# Patient Record
Sex: Female | Born: 1960 | Race: White | Hispanic: No | Smoking: Former smoker
Health system: Southern US, Community
[De-identification: ages and names within clinical notes are randomized; demographics above are authoritative.]

## PROBLEM LIST (undated history)

## (undated) DIAGNOSIS — M8580 Other specified disorders of bone density and structure, unspecified site: Secondary | ICD-10-CM

## (undated) DIAGNOSIS — G032 Benign recurrent meningitis [Mollaret]: Secondary | ICD-10-CM

## (undated) HISTORY — DX: Benign recurrent meningitis (mollaret): G03.2

## (undated) HISTORY — DX: Other specified disorders of bone density and structure, unspecified site: M85.80

## (undated) HISTORY — PX: CHOLECYSTECTOMY: SHX55

## (undated) HISTORY — PX: ABDOMINAL HYSTERECTOMY: SHX81

## (undated) HISTORY — PX: BREAST EXCISIONAL BIOPSY: SUR124

---

## 2008-04-02 ENCOUNTER — Other Ambulatory Visit: Admission: RE | Admit: 2008-04-02 | Discharge: 2008-04-02 | Payer: Self-pay | Admitting: Internal Medicine

## 2008-08-20 ENCOUNTER — Ambulatory Visit: Payer: Self-pay | Admitting: Internal Medicine

## 2009-07-29 ENCOUNTER — Encounter: Admission: RE | Admit: 2009-07-29 | Discharge: 2009-07-29 | Payer: Self-pay | Admitting: Internal Medicine

## 2009-08-22 ENCOUNTER — Ambulatory Visit: Payer: Self-pay | Admitting: Internal Medicine

## 2010-01-02 ENCOUNTER — Ambulatory Visit: Payer: Self-pay | Admitting: Internal Medicine

## 2010-02-23 ENCOUNTER — Ambulatory Visit: Payer: Self-pay | Admitting: Internal Medicine

## 2010-07-31 ENCOUNTER — Encounter: Admission: RE | Admit: 2010-07-31 | Discharge: 2010-07-31 | Payer: Self-pay | Admitting: Internal Medicine

## 2010-09-18 ENCOUNTER — Ambulatory Visit: Payer: Self-pay | Admitting: Internal Medicine

## 2010-10-30 ENCOUNTER — Ambulatory Visit: Payer: Self-pay | Admitting: Internal Medicine

## 2010-10-31 ENCOUNTER — Encounter
Admission: RE | Admit: 2010-10-31 | Discharge: 2010-10-31 | Payer: Self-pay | Source: Home / Self Care | Attending: Internal Medicine | Admitting: Internal Medicine

## 2010-12-15 ENCOUNTER — Other Ambulatory Visit: Payer: Self-pay | Admitting: Internal Medicine

## 2010-12-15 DIAGNOSIS — N63 Unspecified lump in unspecified breast: Secondary | ICD-10-CM

## 2010-12-20 ENCOUNTER — Ambulatory Visit
Admission: RE | Admit: 2010-12-20 | Discharge: 2010-12-20 | Disposition: A | Discharge status: Home / Self Care | Payer: 59 | Source: Ambulatory Visit | Attending: Internal Medicine | Admitting: Internal Medicine

## 2010-12-20 ENCOUNTER — Other Ambulatory Visit: Payer: Self-pay | Admitting: Internal Medicine

## 2010-12-20 DIAGNOSIS — N6310 Unspecified lump in the right breast, unspecified quadrant: Secondary | ICD-10-CM

## 2010-12-20 DIAGNOSIS — N63 Unspecified lump in unspecified breast: Secondary | ICD-10-CM

## 2010-12-21 ENCOUNTER — Other Ambulatory Visit: Payer: Self-pay | Admitting: Diagnostic Radiology

## 2010-12-21 ENCOUNTER — Other Ambulatory Visit: Payer: Self-pay | Admitting: Internal Medicine

## 2010-12-21 ENCOUNTER — Ambulatory Visit
Admission: RE | Admit: 2010-12-21 | Discharge: 2010-12-21 | Disposition: A | Discharge status: Home / Self Care | Payer: 59 | Source: Ambulatory Visit | Attending: Internal Medicine | Admitting: Internal Medicine

## 2010-12-21 DIAGNOSIS — N6320 Unspecified lump in the left breast, unspecified quadrant: Secondary | ICD-10-CM

## 2010-12-21 DIAGNOSIS — N6310 Unspecified lump in the right breast, unspecified quadrant: Secondary | ICD-10-CM

## 2010-12-21 LAB — HM MAMMOGRAPHY

## 2011-05-13 ENCOUNTER — Emergency Department (HOSPITAL_BASED_OUTPATIENT_CLINIC_OR_DEPARTMENT_OTHER)
Admission: EM | Admit: 2011-05-13 | Discharge: 2011-05-13 | Disposition: A | Discharge status: Home / Self Care | Payer: 59 | Attending: Emergency Medicine | Admitting: Emergency Medicine

## 2011-05-13 ENCOUNTER — Emergency Department (INDEPENDENT_AMBULATORY_CARE_PROVIDER_SITE_OTHER): Payer: 59

## 2011-05-13 DIAGNOSIS — M79609 Pain in unspecified limb: Secondary | ICD-10-CM

## 2011-05-13 DIAGNOSIS — X58XXXA Exposure to other specified factors, initial encounter: Secondary | ICD-10-CM | POA: Insufficient documentation

## 2011-05-13 DIAGNOSIS — M7730 Calcaneal spur, unspecified foot: Secondary | ICD-10-CM

## 2011-05-13 DIAGNOSIS — Y9316 Activity, rowing, canoeing, kayaking, rafting and tubing: Secondary | ICD-10-CM | POA: Insufficient documentation

## 2011-05-13 DIAGNOSIS — M25579 Pain in unspecified ankle and joints of unspecified foot: Secondary | ICD-10-CM | POA: Insufficient documentation

## 2011-07-23 ENCOUNTER — Encounter: Payer: Self-pay | Admitting: Internal Medicine

## 2011-07-23 ENCOUNTER — Ambulatory Visit (INDEPENDENT_AMBULATORY_CARE_PROVIDER_SITE_OTHER): Payer: 59 | Admitting: Internal Medicine

## 2011-07-23 VITALS — BP 102/64 | HR 60 | Temp 97.8°F | Ht 63.5 in | Wt 146.0 lb

## 2011-07-23 DIAGNOSIS — J069 Acute upper respiratory infection, unspecified: Secondary | ICD-10-CM

## 2011-07-23 DIAGNOSIS — M8580 Other specified disorders of bone density and structure, unspecified site: Secondary | ICD-10-CM

## 2011-07-23 DIAGNOSIS — M949 Disorder of cartilage, unspecified: Secondary | ICD-10-CM

## 2011-07-23 DIAGNOSIS — E559 Vitamin D deficiency, unspecified: Secondary | ICD-10-CM

## 2011-07-23 DIAGNOSIS — H6500 Acute serous otitis media, unspecified ear: Secondary | ICD-10-CM

## 2011-07-23 DIAGNOSIS — Z8661 Personal history of infections of the central nervous system: Secondary | ICD-10-CM

## 2011-07-31 ENCOUNTER — Telehealth: Payer: Self-pay | Admitting: Internal Medicine

## 2011-07-31 MED ORDER — AZITHROMYCIN 250 MG PO TABS: ORAL_TABLET | ORAL | Status: AC

## 2011-07-31 NOTE — Telephone Encounter (Signed)
Please refill z-pak.

## 2011-08-05 DIAGNOSIS — M8580 Other specified disorders of bone density and structure, unspecified site: Secondary | ICD-10-CM | POA: Insufficient documentation

## 2011-08-05 DIAGNOSIS — E559 Vitamin D deficiency, unspecified: Secondary | ICD-10-CM | POA: Insufficient documentation

## 2011-08-05 DIAGNOSIS — Z8661 Personal history of infections of the central nervous system: Secondary | ICD-10-CM | POA: Insufficient documentation

## 2011-08-05 NOTE — Progress Notes (Signed)
  Subjective:    Patient ID: Rebecca Dunlap, female    DOB: 01-Aug-1961, 50 y.o.   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 Has come down with URI symptoms, malaise and fatigue. Some ear discomfort. She has a history of osteopenia and vitamin D deficiency. Also has a history of recurrent aseptic meningitis treated with Valtrex 1000 mg daily. Has seen Dr. Kerby Nora at [facility] who has suggested this treatment. Recognizing that it's unusual for aseptic meningitis to recur, nonetheless this treatment seems to work well for her with infrequent exacerbations. Her initial exacerbation was in 2005. 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 In June 2008 she was seen at [facility] and subsequently referred to [facility] and received a 3 week course of high dose Valtrex for recurrent meningitis. She had recurrent headaches and was told to increase Valtrex from 500 mg daily to twice daily for one month. History of positive HSV type II antibiotic, negative HIV antibody. 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

## 2011-08-29 ENCOUNTER — Other Ambulatory Visit: Payer: Self-pay | Admitting: Internal Medicine

## 2011-08-29 DIAGNOSIS — Z1231 Encounter for screening mammogram for malignant neoplasm of breast: Secondary | ICD-10-CM

## 2011-09-14 ENCOUNTER — Ambulatory Visit
Admission: RE | Admit: 2011-09-14 | Discharge: 2011-09-14 | Disposition: A | Discharge status: Home / Self Care | Payer: 59 | Source: Ambulatory Visit | Attending: Internal Medicine | Admitting: Internal Medicine

## 2011-09-14 DIAGNOSIS — Z1231 Encounter for screening mammogram for malignant neoplasm of breast: Secondary | ICD-10-CM

## 2011-10-31 ENCOUNTER — Telehealth: Payer: Self-pay | Admitting: Internal Medicine

## 2011-10-31 NOTE — Telephone Encounter (Signed)
Spoke with Dr. Lenord Fellers and she prefers to see patient.  Advised patient we have scheduled her an appt for Thursday, 12/20 at 10:00 a.m.

## 2011-11-01 ENCOUNTER — Ambulatory Visit: Payer: 59 | Admitting: Internal Medicine

## 2011-11-05 ENCOUNTER — Encounter: Payer: Self-pay | Admitting: Internal Medicine

## 2011-11-05 ENCOUNTER — Ambulatory Visit (INDEPENDENT_AMBULATORY_CARE_PROVIDER_SITE_OTHER): Payer: 59 | Admitting: Internal Medicine

## 2011-11-05 DIAGNOSIS — J069 Acute upper respiratory infection, unspecified: Secondary | ICD-10-CM

## 2011-11-05 DIAGNOSIS — R300 Dysuria: Secondary | ICD-10-CM

## 2011-11-05 DIAGNOSIS — R319 Hematuria, unspecified: Secondary | ICD-10-CM

## 2011-11-05 LAB — POCT URINALYSIS DIPSTICK
Bilirubin, UA: NEGATIVE
Blood, UA: NEGATIVE
Glucose, UA: NEGATIVE
Ketones, UA: NEGATIVE
Spec Grav, UA: 1.01
Urobilinogen, UA: NEGATIVE

## 2011-11-05 NOTE — Patient Instructions (Signed)
Take Levaquin 500 mg daily for 10 days with a meal. Take Hycodan 1 teaspoon every 6 hours as needed for cough. Call if not better in 10 days.

## 2011-11-05 NOTE — Progress Notes (Signed)
  Subjective:    Patient ID: Rebecca Dunlap, female    DOB: 05/11/1961, 50 y.o.   MRN: [id number]  HPI patient recently came down with an upper respiratory infection with discolored sputum production and cough. No fever or shaking chills. Just recently a couple of days ago saw some blood when she wiped after urination. Had some dysuria. Has had a little bit of left paralumbar back pain.    Review of Systems     Objective:   Physical Exam urinalysis today is within normal limits. Inspection of the genital area shows no evidence of bleeding. No rectal bleeding either.  HEENT exam: TMs and pharynx are clear; she sounds nasally congested; neck is supple; chest is clear        Assessment & Plan:  URI  ? Genital excoriation versus early urinary tract infection-urinalysis today is within normal limits. Attended favor of some type of genital excoriation.  Plan: Levaquin 500 milligrams daily for 10 days. Hycodan 8 ounces 1 teaspoon by mouth every 6 hours when necessary cough.

## 2011-11-09 ENCOUNTER — Other Ambulatory Visit: Payer: Self-pay | Admitting: Internal Medicine

## 2011-11-09 NOTE — Telephone Encounter (Signed)
Please call pt. She was given 8 oz of cough syrup to be taken q 6 hours. She should not be out after 4 days. Taken as directed, there should be enough for 12 days. She just got this on 12/24 ie  4 days ago.

## 2012-03-24 ENCOUNTER — Ambulatory Visit (INDEPENDENT_AMBULATORY_CARE_PROVIDER_SITE_OTHER): Payer: 59 | Admitting: Internal Medicine

## 2012-03-24 ENCOUNTER — Encounter: Payer: Self-pay | Admitting: Internal Medicine

## 2012-03-24 VITALS — BP 94/66 | HR 76 | Temp 98.4°F | Wt 139.5 lb

## 2012-03-24 DIAGNOSIS — R5381 Other malaise: Secondary | ICD-10-CM

## 2012-03-24 DIAGNOSIS — R109 Unspecified abdominal pain: Secondary | ICD-10-CM

## 2012-03-24 LAB — POCT URINALYSIS DIPSTICK
Blood, UA: NEGATIVE
Protein, UA: NEGATIVE
Spec Grav, UA: 1.01
Urobilinogen, UA: NEGATIVE

## 2012-03-24 LAB — CBC WITH DIFFERENTIAL/PLATELET
Eosinophils Relative: 4 % (ref 0–5)
Hemoglobin: 12.6 g/dL (ref 12.0–15.0)
Lymphocytes Relative: 27 % (ref 12–46)
Lymphs Abs: 2.8 10*3/uL (ref 0.7–4.0)
MCV: 90.1 fL (ref 78.0–100.0)
Monocytes Relative: 6 % (ref 3–12)
Platelets: 232 10*3/uL (ref 150–400)
RBC: 4.25 MIL/uL (ref 3.87–5.11)
WBC: 10.5 10*3/uL (ref 4.0–10.5)

## 2012-03-24 LAB — COMPREHENSIVE METABOLIC PANEL
ALT: 12 U/L (ref 0–35)
CO2: 30 mEq/L (ref 19–32)
Calcium: 9.7 mg/dL (ref 8.4–10.5)
Chloride: 100 mEq/L (ref 96–112)
Creat: 0.76 mg/dL (ref 0.50–1.10)
Glucose, Bld: 83 mg/dL (ref 70–99)
Sodium: 141 mEq/L (ref 135–145)
Total Protein: 6.4 g/dL (ref 6.0–8.3)

## 2012-03-24 LAB — TSH: TSH: 0.997 u[IU]/mL (ref 0.350–4.500)

## 2012-03-25 LAB — SEDIMENTATION RATE: Sed Rate: 6 mm/hr (ref 0–22)

## 2012-04-21 ENCOUNTER — Other Ambulatory Visit: Payer: Self-pay | Admitting: Internal Medicine

## 2012-04-21 ENCOUNTER — Ambulatory Visit
Admission: RE | Admit: 2012-04-21 | Discharge: 2012-04-21 | Disposition: A | Discharge status: Home / Self Care | Payer: 59 | Source: Ambulatory Visit | Attending: Internal Medicine | Admitting: Internal Medicine

## 2012-04-21 ENCOUNTER — Encounter: Payer: Self-pay | Admitting: Internal Medicine

## 2012-04-21 ENCOUNTER — Ambulatory Visit (INDEPENDENT_AMBULATORY_CARE_PROVIDER_SITE_OTHER): Payer: 59 | Admitting: Internal Medicine

## 2012-04-21 VITALS — Temp 98.9°F

## 2012-04-21 DIAGNOSIS — R161 Splenomegaly, not elsewhere classified: Secondary | ICD-10-CM

## 2012-04-21 DIAGNOSIS — R509 Fever, unspecified: Secondary | ICD-10-CM

## 2012-04-21 DIAGNOSIS — R0989 Other specified symptoms and signs involving the circulatory and respiratory systems: Secondary | ICD-10-CM

## 2012-04-21 DIAGNOSIS — R5383 Other fatigue: Secondary | ICD-10-CM

## 2012-04-21 DIAGNOSIS — R5381 Other malaise: Secondary | ICD-10-CM

## 2012-04-21 LAB — CBC WITH DIFFERENTIAL/PLATELET
Eosinophils Absolute: 0.4 10*3/uL (ref 0.0–0.7)
Eosinophils Relative: 4 % (ref 0–5)
HCT: 39.8 % (ref 36.0–46.0)
Hemoglobin: 13.3 g/dL (ref 12.0–15.0)
Lymphs Abs: 3 10*3/uL (ref 0.7–4.0)
MCH: 30.3 pg (ref 26.0–34.0)
MCV: 90.7 fL (ref 78.0–100.0)
Monocytes Relative: 6 % (ref 3–12)
RBC: 4.39 MIL/uL (ref 3.87–5.11)

## 2012-04-21 NOTE — Patient Instructions (Addendum)
A number of lab studies have been drawn today. Please have chest x-ray done. CT of abdomen and pelvis will be ordered.

## 2012-04-21 NOTE — Progress Notes (Signed)
  Subjective:    Patient ID: Rebecca Dunlap, female    DOB: 1961-02-24, 51 y.o.   MRN: [id number]  HPI 51 year old white female who has not felt well for couple of months now. Complaining of some left upper abdominal pain. Was seen here in mid May complaining of extreme fatigue and some vague abdominal pain. We drew CBC with differential, C-met, TSH and sedimentation rate all of which proved to be within normal limits. Says she still does not feel well. Today however has a new problem, she has conjunctivitis right eye with some infraorbital erythema. Low-grade fever. Has noticed small nodule right anterior leg. Says left upper abdominal area feels hard to her. Has history of recurrent aseptic meningitis. History of PCR on spinal fluid being positive for HSV. Has responded well to Valtrex 1 g 3 times daily from time to time with symptoms. At one point took Valtrex 500 mg daily for prophylaxis. Has not been taking Valtrex for some time now. History of positive HSV type II antibody. 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 Has had an MRI of the brain in the past that was unremarkable.    Review of Systems     Objective:   Physical Exam abdomen soft nondistended no no hepatomegaly. Spleen tip palpated. No other masses appreciated. Has small 1 cm nodule right anterior thigh. Seems to be small I, or intradermal lesion.        Assessment & Plan:  History of Mollaret's meningitis with recurrence from time to time  Extreme fatigue  Right conjunctivitis with infraorbital cellulitis  Possible splenomegaly  Plan: Patient will have chest x-ray PA and lateral. She'll let's CT of abdomen and pelvis. A number of lab studies drawn today including Epstein-Barr virus and CMV viral titers. RMSF titer drawn. ANA, CCP, total CK drawn. Have suggested she may need to return to [facility] for reevaluation. She says she was thinking of asking me to do a spinal  tap. Told her that I do not perform spinal taps.  Addendum: Possible right lung nodule noted on chest x-ray. Chest x-ray needs to be repeated with nipple markers according to radiologist.

## 2012-04-22 ENCOUNTER — Other Ambulatory Visit: Payer: 59

## 2012-04-22 ENCOUNTER — Ambulatory Visit
Admission: RE | Admit: 2012-04-22 | Discharge: 2012-04-22 | Disposition: A | Discharge status: Home / Self Care | Payer: 59 | Source: Ambulatory Visit | Attending: Internal Medicine | Admitting: Internal Medicine

## 2012-04-22 DIAGNOSIS — R161 Splenomegaly, not elsewhere classified: Secondary | ICD-10-CM

## 2012-04-22 DIAGNOSIS — R0989 Other specified symptoms and signs involving the circulatory and respiratory systems: Secondary | ICD-10-CM

## 2012-04-22 LAB — ANA: Anti Nuclear Antibody(ANA): NEGATIVE

## 2012-04-22 LAB — ROCKY MTN SPOTTED FVR ABS PNL(IGG+IGM)
RMSF IgG: 0.16 IV
RMSF IgM: 0.23 IV

## 2012-04-22 LAB — CK: Total CK: 104 U/L (ref 7–177)

## 2012-04-22 MED ORDER — IOHEXOL 300 MG/ML  SOLN
30.0000 mL | Freq: Once | INTRAMUSCULAR | Status: AC | PRN
  Administered 2012-04-22: 30 mL via ORAL

## 2012-04-22 MED ORDER — IOHEXOL 300 MG/ML  SOLN
100.0000 mL | Freq: Once | INTRAMUSCULAR | Status: AC | PRN
  Administered 2012-04-22: 100 mL via INTRAVENOUS

## 2012-04-23 LAB — PROTEIN ELECTROPHORESIS, SERUM
Albumin ELP: 64.2 % (ref 55.8–66.1)
Alpha-2-Globulin: 9.6 % (ref 7.1–11.8)
Beta Globulin: 5.6 % (ref 4.7–7.2)
Total Protein, Serum Electrophoresis: 6.2 g/dL (ref 6.0–8.3)

## 2012-04-24 ENCOUNTER — Telehealth: Payer: Self-pay

## 2012-04-24 NOTE — Telephone Encounter (Signed)
Need abd CT results. Please check on EB titer and CMV titer results. They are not back. All other extensive lab work has turned up negative. Suggest pt see opthalmologist about her eye.Does she have one?

## 2012-04-24 NOTE — Telephone Encounter (Signed)
Patient here for OV on Monday. Taking Levaquin and rx eye drops and does not see any improvement at all.

## 2012-04-25 NOTE — Telephone Encounter (Signed)
Spoke with patient and advised her of Dr. Beryle Quant rec to see opthamologist if eye is no better. She says she will give it one more day and then decide. Also informed her CT abdomen and pelvis within normal limits.

## 2012-05-02 ENCOUNTER — Other Ambulatory Visit: Payer: 59 | Admitting: Internal Medicine

## 2012-05-02 DIAGNOSIS — R161 Splenomegaly, not elsewhere classified: Secondary | ICD-10-CM

## 2012-05-02 NOTE — Addendum Note (Signed)
Addended by: Judy Pimple on: 05/02/2012 01:05 PM   Modules accepted: Orders

## 2012-05-05 LAB — EPSTEIN-BARR VIRUS VCA ANTIBODY PANEL: EBV VCA IgG: 112 U/mL — ABNORMAL HIGH (ref <18.0)

## 2012-05-09 ENCOUNTER — Ambulatory Visit: Payer: 59 | Admitting: Internal Medicine

## 2012-05-11 ENCOUNTER — Encounter: Payer: Self-pay | Admitting: Internal Medicine

## 2012-05-11 NOTE — Progress Notes (Signed)
  Subjective:    Patient ID: Rebecca Dunlap, female    DOB: 09-28-1961, 51 y.o.   MRN: [id number]  HPI 51 year old white female with history of recurrent Mollaret's (aseptic Herpetic )meningitis previously treated by [facility] with chronic Valtrex therapy. Patient  currently not on Valtrex. Patient has had extreme fatigue along with left upper quadrant and left flank pain. 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 By the end of the day she is exhausted. It is not clear to me what is causing the symptoms. Occasional headache but nothing significant. No fever. No shaking chills. No urinary symptoms. Symptoms are vague and focus around fatigue and musculoskeletal pain. Patient denies depression.    Review of Systems     Objective:   Physical Exam HEENT exam: TMs and pharynx are clear; Neck is supple without thyromegaly. No adenopathy. Chest is clear to auscultation; cardiac exam regular rate and rhythm normal S1 and S2 without murmur; abdomen bowel sounds are active no hepatosplenomegaly appreciated. Mild left upper quadrant and left flank tenderness. Extremities without deformity or joint swelling. No rash.        Assessment & Plan:  Extreme fatigue  Musculoskeletal pain  History of aseptic meningitis which is recurrent  Plan: CBC with differential drawn C. met, TSH and sedimentation rate. Could be recurrence of aseptic meningitis but headache does not seem to be prominent as it has been in the past when she had recurrence of aseptic meningitis.  Await results of lab work. See if other symptoms develop.

## 2012-05-11 NOTE — Patient Instructions (Addendum)
Lab work has been drawn today to check for fatigue symptoms. Try to cut back on physical activity. Await lab work results.

## 2012-07-15 ENCOUNTER — Telehealth: Payer: Self-pay | Admitting: Internal Medicine

## 2012-07-23 ENCOUNTER — Telehealth: Payer: Self-pay

## 2012-07-25 NOTE — Telephone Encounter (Signed)
Patient informed she does not need Ct of chest because radiologist who read abdominal CT states lung area, costophrenic angles are completely clear.

## 2012-10-28 ENCOUNTER — Other Ambulatory Visit: Payer: 59 | Admitting: Internal Medicine

## 2012-10-28 DIAGNOSIS — Z0000 Encounter for general adult medical examination without abnormal findings: Secondary | ICD-10-CM

## 2012-10-28 LAB — CBC WITH DIFFERENTIAL/PLATELET
Basophils Absolute: 0 10*3/uL (ref 0.0–0.1)
Eosinophils Relative: 4 % (ref 0–5)
HCT: 38.7 % (ref 36.0–46.0)
Hemoglobin: 13.1 g/dL (ref 12.0–15.0)
Lymphocytes Relative: 32 % (ref 12–46)
Lymphs Abs: 2.1 10*3/uL (ref 0.7–4.0)
MCV: 88.8 fL (ref 78.0–100.0)
Monocytes Absolute: 0.5 10*3/uL (ref 0.1–1.0)
Monocytes Relative: 8 % (ref 3–12)
RDW: 14.3 % (ref 11.5–15.5)
WBC: 6.7 10*3/uL (ref 4.0–10.5)

## 2012-10-28 LAB — LIPID PANEL
Cholesterol: 173 mg/dL (ref 0–200)
HDL: 75 mg/dL (ref >39)
Total CHOL/HDL Ratio: 2.3 Ratio
Triglycerides: 87 mg/dL (ref <150)

## 2012-10-28 LAB — COMPREHENSIVE METABOLIC PANEL
Albumin: 4.1 g/dL (ref 3.5–5.2)
BUN: 16 mg/dL (ref 6–23)
CO2: 29 mEq/L (ref 19–32)
Calcium: 9.3 mg/dL (ref 8.4–10.5)
Chloride: 104 mEq/L (ref 96–112)
Creat: 0.8 mg/dL (ref 0.50–1.10)
Glucose, Bld: 75 mg/dL (ref 70–99)
Potassium: 4.2 mEq/L (ref 3.5–5.3)

## 2012-10-30 ENCOUNTER — Encounter: Payer: 59 | Admitting: Internal Medicine

## 2012-11-10 ENCOUNTER — Encounter: Payer: Self-pay | Admitting: Internal Medicine

## 2012-11-10 ENCOUNTER — Other Ambulatory Visit (HOSPITAL_COMMUNITY)
Admission: RE | Admit: 2012-11-10 | Discharge: 2012-11-10 | Disposition: A | Discharge status: Home / Self Care | Payer: 59 | Source: Ambulatory Visit | Attending: Internal Medicine | Admitting: Internal Medicine

## 2012-11-10 ENCOUNTER — Ambulatory Visit (INDEPENDENT_AMBULATORY_CARE_PROVIDER_SITE_OTHER): Payer: 59 | Admitting: Internal Medicine

## 2012-11-10 ENCOUNTER — Other Ambulatory Visit: Payer: Self-pay | Admitting: Internal Medicine

## 2012-11-10 VITALS — BP 110/80 | HR 76 | Temp 98.2°F | Ht 63.5 in | Wt 145.0 lb

## 2012-11-10 DIAGNOSIS — Z1272 Encounter for screening for malignant neoplasm of vagina: Secondary | ICD-10-CM

## 2012-11-10 DIAGNOSIS — Z1231 Encounter for screening mammogram for malignant neoplasm of breast: Secondary | ICD-10-CM

## 2012-11-10 DIAGNOSIS — Z0000 Encounter for general adult medical examination without abnormal findings: Secondary | ICD-10-CM

## 2012-11-10 DIAGNOSIS — Z01419 Encounter for gynecological examination (general) (routine) without abnormal findings: Secondary | ICD-10-CM | POA: Insufficient documentation

## 2012-11-10 LAB — POCT URINALYSIS DIPSTICK
Bilirubin, UA: NEGATIVE
Blood, UA: NEGATIVE
Glucose, UA: NEGATIVE
Nitrite, UA: NEGATIVE
Spec Grav, UA: 1.02

## 2012-11-11 ENCOUNTER — Ambulatory Visit
Admission: RE | Admit: 2012-11-11 | Discharge: 2012-11-11 | Disposition: A | Discharge status: Home / Self Care | Payer: 59 | Source: Ambulatory Visit | Attending: Internal Medicine | Admitting: Internal Medicine

## 2012-11-11 ENCOUNTER — Ambulatory Visit (HOSPITAL_COMMUNITY)
Admission: RE | Admit: 2012-11-11 | Discharge: 2012-11-11 | Disposition: A | Discharge status: Home / Self Care | Payer: 59 | Source: Ambulatory Visit | Attending: Internal Medicine | Admitting: Internal Medicine

## 2012-11-11 ENCOUNTER — Other Ambulatory Visit: Payer: Self-pay | Admitting: Internal Medicine

## 2012-11-11 DIAGNOSIS — Z9079 Acquired absence of other genital organ(s): Secondary | ICD-10-CM | POA: Insufficient documentation

## 2012-11-11 DIAGNOSIS — Z8041 Family history of malignant neoplasm of ovary: Secondary | ICD-10-CM | POA: Insufficient documentation

## 2012-11-11 DIAGNOSIS — Z90710 Acquired absence of both cervix and uterus: Secondary | ICD-10-CM | POA: Insufficient documentation

## 2012-11-11 DIAGNOSIS — Z0000 Encounter for general adult medical examination without abnormal findings: Secondary | ICD-10-CM

## 2012-11-11 DIAGNOSIS — Z1231 Encounter for screening mammogram for malignant neoplasm of breast: Secondary | ICD-10-CM
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
## 2012-11-16 NOTE — Patient Instructions (Addendum)
Continue same meds and return in one year. Arrange for screening colonoscopy. Arrange annual mammogram and ultrasound of the ovary

## 2012-11-18 ENCOUNTER — Telehealth: Payer: Self-pay

## 2012-11-18 DIAGNOSIS — Z1211 Encounter for screening for malignant neoplasm of colon: Secondary | ICD-10-CM

## 2012-11-18 NOTE — Telephone Encounter (Signed)
Patient scheduled for screening colon on 12/31/2012 at 730 am per Dr. Leone Payor. Informed of this

## 2012-12-25 ENCOUNTER — Encounter: Payer: 59 | Admitting: Internal Medicine

## 2013-09-26 ENCOUNTER — Encounter (HOSPITAL_BASED_OUTPATIENT_CLINIC_OR_DEPARTMENT_OTHER): Payer: Self-pay | Admitting: Emergency Medicine

## 2013-09-26 ENCOUNTER — Emergency Department (HOSPITAL_BASED_OUTPATIENT_CLINIC_OR_DEPARTMENT_OTHER): Payer: BC Managed Care – PPO

## 2013-09-26 ENCOUNTER — Emergency Department (HOSPITAL_BASED_OUTPATIENT_CLINIC_OR_DEPARTMENT_OTHER)
Admission: EM | Admit: 2013-09-26 | Discharge: 2013-09-27 | Disposition: A | Discharge status: Other Acute Inpatient Hospital | Payer: BC Managed Care – PPO | Attending: Emergency Medicine | Admitting: Emergency Medicine

## 2013-09-26 DIAGNOSIS — R1310 Dysphagia, unspecified: Secondary | ICD-10-CM | POA: Insufficient documentation

## 2013-09-26 DIAGNOSIS — Z862 Personal history of diseases of the blood and blood-forming organs and certain disorders involving the immune mechanism: Secondary | ICD-10-CM | POA: Insufficient documentation

## 2013-09-26 DIAGNOSIS — M542 Cervicalgia: Secondary | ICD-10-CM | POA: Insufficient documentation

## 2013-09-26 DIAGNOSIS — Z8639 Personal history of other endocrine, nutritional and metabolic disease: Secondary | ICD-10-CM | POA: Insufficient documentation

## 2013-09-26 DIAGNOSIS — Z79899 Other long term (current) drug therapy: Secondary | ICD-10-CM | POA: Insufficient documentation

## 2013-09-26 DIAGNOSIS — Z7982 Long term (current) use of aspirin: Secondary | ICD-10-CM | POA: Insufficient documentation

## 2013-09-26 DIAGNOSIS — Z8739 Personal history of other diseases of the musculoskeletal system and connective tissue: Secondary | ICD-10-CM | POA: Insufficient documentation

## 2013-09-26 DIAGNOSIS — Z87891 Personal history of nicotine dependence: Secondary | ICD-10-CM | POA: Insufficient documentation

## 2013-09-26 DIAGNOSIS — Z8661 Personal history of infections of the central nervous system: Secondary | ICD-10-CM | POA: Insufficient documentation

## 2013-09-26 LAB — CBC WITH DIFFERENTIAL/PLATELET
Eosinophils Absolute: 0.3 10*3/uL (ref 0.0–0.7)
Hemoglobin: 13.3 g/dL (ref 12.0–15.0)
Lymphocytes Relative: 27 % (ref 12–46)
Lymphs Abs: 3.1 10*3/uL (ref 0.7–4.0)
MCH: 30.4 pg (ref 26.0–34.0)
MCV: 91.5 fL (ref 78.0–100.0)
Monocytes Relative: 9 % (ref 3–12)
Neutrophils Relative %: 61 % (ref 43–77)
Platelets: 248 10*3/uL (ref 150–400)
RBC: 4.37 MIL/uL (ref 3.87–5.11)
WBC: 11.5 10*3/uL — ABNORMAL HIGH (ref 4.0–10.5)

## 2013-09-26 LAB — COMPREHENSIVE METABOLIC PANEL
ALT: 19 U/L (ref 0–35)
Alkaline Phosphatase: 98 U/L (ref 39–117)
BUN: 14 mg/dL (ref 6–23)
CO2: 27 mEq/L (ref 19–32)
GFR calc Af Amer: >90 mL/min (ref >90)
GFR calc non Af Amer: 84 mL/min — ABNORMAL LOW (ref >90)
Glucose, Bld: 111 mg/dL — ABNORMAL HIGH (ref 70–99)
Potassium: 4.1 mEq/L (ref 3.5–5.1)
Sodium: 140 mEq/L (ref 135–145)
Total Bilirubin: 0.4 mg/dL (ref 0.3–1.2)
Total Protein: 7.2 g/dL (ref 6.0–8.3)

## 2013-09-26 LAB — PROTEIN AND GLUCOSE, CSF
Glucose, CSF: 68 mg/dL (ref 43–76)
Total  Protein, CSF: 24 mg/dL (ref 15–45)

## 2013-09-26 MED ORDER — HYDROMORPHONE HCL PF 1 MG/ML IJ SOLN
INTRAMUSCULAR | Status: AC
  Filled 2013-09-26: qty 1

## 2013-09-26 MED ORDER — DEXTROSE 5 % IV SOLN
2.0000 g | Freq: Once | INTRAVENOUS | Status: AC
  Administered 2013-09-26: 2 g via INTRAVENOUS

## 2013-09-26 MED ORDER — HYDROMORPHONE HCL PF 1 MG/ML IJ SOLN
1.0000 mg | Freq: Once | INTRAMUSCULAR | Status: AC
  Administered 2013-09-26: 1 mg via INTRAVENOUS
  Filled 2013-09-26: qty 1

## 2013-09-26 MED ORDER — DEXTROSE 5 % IV SOLN: 10.0000 mg/kg | Freq: Once | INTRAVENOUS | Status: AC

## 2013-09-26 MED ORDER — LIDOCAINE-EPINEPHRINE 1 %-1:100000 IJ SOLN
INTRAMUSCULAR | Status: AC
  Administered 2013-09-26: 1 mL
  Filled 2013-09-26: qty 1

## 2013-09-26 MED ORDER — IOHEXOL 300 MG/ML  SOLN
80.0000 mL | Freq: Once | INTRAMUSCULAR | Status: AC | PRN
  Administered 2013-09-26: 80 mL via INTRAVENOUS

## 2013-09-26 MED ORDER — CEFTRIAXONE SODIUM 2 G IJ SOLR
INTRAMUSCULAR | Status: AC
  Filled 2013-09-26: qty 2

## 2013-09-26 MED ORDER — HYDROMORPHONE HCL PF 1 MG/ML IJ SOLN
1.0000 mg | Freq: Once | INTRAMUSCULAR | Status: AC
  Administered 2013-09-26: 1 mg via INTRAVENOUS

## 2013-09-26 MED ORDER — ACYCLOVIR SODIUM 50 MG/ML IV SOLN
INTRAVENOUS | Status: AC
  Administered 2013-09-26: 650 mg
  Filled 2013-09-26: qty 20

## 2013-09-26 MED ORDER — VANCOMYCIN HCL IN DEXTROSE 1-5 GM/200ML-% IV SOLN: 1000.0000 mg | Freq: Once | INTRAVENOUS | Status: DC

## 2013-09-26 NOTE — ED Notes (Signed)
Pt states she started having neck pain last night. Today she noticed some difficulty swallowing.  No known fever.  Pt states she has history of meningitis which causes the neck pain.  No weakness.  Some facial tingling.

## 2013-09-26 NOTE — ED Notes (Signed)
No bleeding noted at lumbar region. Pt denies headache. Continues to c/o neck pain that she has had during LOS.

## 2013-09-26 NOTE — ED Provider Notes (Addendum)
CSN: [id number]     Arrival date & time 09/26/13  1710 History  This chart was scribed for Hilario Quarry, MD by Clydene Laming, ED Scribe. This patient was seen in room MH03/MH03 and the patient's care was started at 6:23 PM.   Chief Complaint  Patient presents with  . Neck Pain  . Dysphagia   The history is provided by the patient. No language interpreter was used.  HPI Comments: Rebecca Dunlap is a 52 y.o. female who presents to the Emergency Department complaining of neck pain onset last night with associated dysphagia. The pain began on the right side of the neck and felt like a muscle strain. The pain is now also in the back of the neck. Pt denies any trauma. Pt has a hx of meningitis.She was hospitalized twice for this (6-7 yrs ago). Pt denies fever or headache. Pt does not have any other medical issues. Pt takes omega 3 and fiber 1 capsules on a regular basis. Pt does not smoke but does drink alcohol occasionally. Pt denies weakness. Pt has taken ibuprofen for relief and also has a patch on the shoulder.    Past Medical History  Diagnosis Date  . Osteopenia   . Vitamin D deficiency   . Benign recurrent aseptic meningitis    Past Surgical History  Procedure Laterality Date  . Abdominal hysterectomy    . Cholecystectomy     History reviewed. No pertinent family history. History  Substance Use Topics  . Smoking status: Former Smoker -- 0.20 packs/day for 3 years    Types: Cigarettes    Quit date: 11/12/1990  . Smokeless tobacco: Not on file  . Alcohol Use: Not on file   OB History   Grav Para Term Preterm Abortions TAB SAB Ect Mult Living                 Review of Systems  Constitutional: Negative for fever.  Musculoskeletal: Positive for myalgias and neck pain.  Neurological: Negative for weakness and headaches.    Allergies  Review of patient's allergies indicates no active allergies.  Home Medications   Current Outpatient Rx  Name  Route  Sig  Dispense  Refill   . aspirin 81 MG tablet   Oral   Take 81 mg by mouth daily.         . fish oil-omega-3 fatty acids 1000 MG capsule   Oral   Take 2 g by mouth.           . psyllium (REGULOID) 0.52 G capsule   Oral   Take 0.52 g by mouth daily.          Triage Vitals:BP 140/83  Pulse 72  Temp(Src) 98.1 F (36.7 C)  Resp 16  Ht 5\' 3"  (1.6 m)  Wt 143 lb (64.864 kg)  BMI 25.34 kg/m2  SpO2 97% Physical Exam  Nursing note and vitals reviewed. Constitutional: She appears well-developed and well-nourished. No distress.  Awake, alert, nontoxic appearance  HENT:  Head: Normocephalic and atraumatic.  Mouth/Throat: Oropharynx is clear and moist. No oropharyngeal exudate.  Eyes: Conjunctivae are normal. No scleral icterus.  Neck: Normal range of motion. Neck supple.  Cardiovascular: Normal rate, regular rhythm and intact distal pulses.   Pulmonary/Chest: Effort normal and breath sounds normal. No respiratory distress. She has no wheezes.  Abdominal: Soft. Bowel sounds are normal. She exhibits no mass. There is no tenderness. There is no rebound and no guarding.  Musculoskeletal: Normal range of motion.  She exhibits no edema.  Neurological: She is alert.  Speech is clear and goal oriented Moves extremities without ataxia  Skin: Skin is warm and dry. She is not diaphoretic.  Psychiatric: She has a normal mood and affect.    ED Course  LUMBAR PUNCTURE Date/Time: 09/26/2013 10:46 PM Performed by: Hilario Quarry Authorized by: Hilario Quarry Consent: Verbal consent obtained. written consent obtained. Risks and benefits: risks, benefits and alternatives were discussed Consent given by: patient Patient identity confirmed: verbally with patient and arm band Time out: Immediately prior to procedure a "time out" was called to verify the correct patient, procedure, equipment, support staff and site/side marked as required. Indications: evaluation for infection Anesthesia: local  infiltration Local anesthetic: lidocaine 1% with epinephrine Patient sedated: no Preparation: Patient was prepped and draped in the usual sterile fashion. Lumbar space: L3-L4 interspace Patient's position: left lateral decubitus Needle gauge: 22 Needle type: spinal needle - Quincke tip Needle length: 2.5 in Number of attempts: 3 Opening pressure: 17 cm H2O Fluid appearance: clear Tubes of fluid: 4 Total volume: 4 ml Post-procedure: site cleaned, pressure dressing applied and adhesive bandage applied Patient tolerance: Patient tolerated the procedure well with no immediate complications.   (including critical care time) DIAGNOSTIC STUDIES: Oxygen Saturation is 97% on RA, normal by my interpretation.    COORDINATION OF CARE: 6:29 PM- Discussed treatment plan with pt at bedside. Pt verbalized understanding and agreement with plan.   Labs Review Labs Reviewed - No data to display Imaging Review No results found.  EKG Interpretation   None      Results for orders placed during the hospital encounter of 09/26/13  CBC WITH DIFFERENTIAL      Result Value Range   WBC 11.5 (*) 4.0 - 10.5 K/uL   RBC 4.37  3.87 - 5.11 MIL/uL   Hemoglobin 13.3  12.0 - 15.0 g/dL   HCT 16.1  09.6 - 04.5 %   MCV 91.5  78.0 - 100.0 fL   MCH 30.4  26.0 - 34.0 pg   MCHC 33.3  30.0 - 36.0 g/dL   RDW 40.9  81.1 - 91.4 %   Platelets 248  150 - 400 K/uL   Neutrophils Relative % 61  43 - 77 %   Neutro Abs 7.0  1.7 - 7.7 K/uL   Lymphocytes Relative 27  12 - 46 %   Lymphs Abs 3.1  0.7 - 4.0 K/uL   Monocytes Relative 9  3 - 12 %   Monocytes Absolute 1.0  0.1 - 1.0 K/uL   Eosinophils Relative 3  0 - 5 %   Eosinophils Absolute 0.3  0.0 - 0.7 K/uL   Basophils Relative 0  0 - 1 %   Basophils Absolute 0.1  0.0 - 0.1 K/uL  COMPREHENSIVE METABOLIC PANEL      Result Value Range   Sodium 140  135 - 145 mEq/L   Potassium 4.1  3.5 - 5.1 mEq/L   Chloride 103  96 - 112 mEq/L   CO2 27  19 - 32 mEq/L   Glucose,  Bld 111 (*) 70 - 99 mg/dL   BUN 14  6 - 23 mg/dL   Creatinine, Ser 7.82  0.50 - 1.10 mg/dL   Calcium 9.4  8.4 - 95.6 mg/dL   Total Protein 7.2  6.0 - 8.3 g/dL   Albumin 4.0  3.5 - 5.2 g/dL   AST 26  0 - 37 U/L   ALT 19  0 -  35 U/L   Alkaline Phosphatase 98  39 - 117 U/L   Total Bilirubin 0.4  0.3 - 1.2 mg/dL   GFR calc non Af Amer 84 (*) >90 mL/min   GFR calc Af Amer >90  >90 mL/min    MDM  No diagnosis found.  Consulted wit Dr. Roseanne Reno, on call neurology, advises lp and treat with acyclovir if aseptic.   8:40 PM Patient permitted for lumbar puncture.  Awaiting ct results.  Patient continues with neck pain and some headache at base of head/upper spine.   Ct Head Wo Contrast  09/26/2013   CLINICAL DATA:  Neck pain, dysphagia, right-sided neck pain.  EXAM: CT HEAD WITH CONTRAST  CT NECK WITH CONTRAST  TECHNIQUE: Contiguous axial images were obtained from the base of the skull through the vertex without contrast.  Multidetector CT imaging of the and neck was performed using the standard protocol following the bolus administration of intravenous contrast.  CONTRAST:  80mL OMNIPAQUE IOHEXOL 300 MG/ML  SOLN  COMPARISON:  None discussed at PET-CT 04/23/2007  FINDINGS: CT HEAD FINDINGS  No acute intracranial hemorrhage. No focal mass lesion. No CT evidence of acute infarction. No midline shift or mass effect. No hydrocephalus. Basilar cisterns are patent. Paranasal sinuses and mastoid air cells are clear.  CT NECK FINDINGS  No evidence of mass lesion in the left or right neck with particular attention directed to the right. No evidence of abscess or adenopathy.  There is fullness within the posterior right oronasopharynx which is asymmetric measuring 16 x 14 mm (image 13) at the level of the eustachian tubes (fossa of Rosenmuller). The the lingual tonsil region ond base of tongue appear normal. The glottis epiglottis appear normal. Normal vasculature. Thyroid glands appear normal.  IMPRESSION: No  acute intracranial findings.  No evidence of abscess within the neck  There is fullness within the posterior right oronasopharynx at the level of the fossa of Rosenmuller. Recommend nonemergent direct visualization to exclude neoplasm or inflammation.   Electronically Signed   By: Genevive Bi M.D.   On: 09/26/2013 21:16   Ct Soft Tissue Neck W Contrast  09/26/2013   CLINICAL DATA:  Neck pain, dysphagia, right-sided neck pain.  EXAM: CT HEAD WITH CONTRAST  CT NECK WITH CONTRAST  TECHNIQUE: Contiguous axial images were obtained from the base of the skull through the vertex without contrast.  Multidetector CT imaging of the and neck was performed using the standard protocol following the bolus administration of intravenous contrast.  CONTRAST:  80mL OMNIPAQUE IOHEXOL 300 MG/ML  SOLN  COMPARISON:  None discussed at PET-CT 04/23/2007  FINDINGS: CT HEAD FINDINGS  No acute intracranial hemorrhage. No focal mass lesion. No CT evidence of acute infarction. No midline shift or mass effect. No hydrocephalus. Basilar cisterns are patent. Paranasal sinuses and mastoid air cells are clear.  CT NECK FINDINGS  No evidence of mass lesion in the left or right neck with particular attention directed to the right. No evidence of abscess or adenopathy.  There is fullness within the posterior right oronasopharynx which is asymmetric measuring 16 x 14 mm (image 13) at the level of the eustachian tubes (fossa of Rosenmuller). The the lingual tonsil region ond base of tongue appear normal. The glottis epiglottis appear normal. Normal vasculature. Thyroid glands appear normal.  IMPRESSION: No acute intracranial findings.  No evidence of abscess within the neck  There is fullness within the posterior right oronasopharynx at the level of the fossa of Rosenmuller. Recommend nonemergent  direct visualization to exclude neoplasm or inflammation.   Electronically Signed   By: Genevive Bi M.D.   On: 09/26/2013 21:16    Patient with  severe neck pain and headache with history of herpes meningitis.  LP and imaging done.  Patient with normal head ct and fullness right oronasopharynx of unclear etiology but consistent with right sided throat and neck pain.  Patient given acyclovir, rocephin, and vancomycin here.  CSF is being processed including herpes simplex dna pcr.  Plan admission for pain control and ongoing treatment and assessment of possible meningitis and abnormal neck ct.   I personally performed the services described in this documentation, which was scribed in my presence. The recorded information has been reviewed and considered.   Hilario Quarry, MD 09/26/13 2154  Patient discussed with Dr.Sobaria at [facility] per patient's request and awaiting bed assignment.   Hilario Quarry, MD 09/26/13 [phone]

## 2013-09-26 NOTE — ED Notes (Signed)
Assigned to bed 636 @ [facility] per [employer], RN notified, [facility] called for transport.

## 2013-09-27 DIAGNOSIS — Z87891 Personal history of nicotine dependence: Secondary | ICD-10-CM | POA: Diagnosis not present

## 2013-09-27 DIAGNOSIS — Z7982 Long term (current) use of aspirin: Secondary | ICD-10-CM | POA: Diagnosis not present

## 2013-09-27 DIAGNOSIS — Z79899 Other long term (current) drug therapy: Secondary | ICD-10-CM | POA: Diagnosis not present

## 2013-09-27 DIAGNOSIS — M542 Cervicalgia: Secondary | ICD-10-CM | POA: Diagnosis present

## 2013-09-27 DIAGNOSIS — Z8661 Personal history of infections of the central nervous system: Secondary | ICD-10-CM | POA: Diagnosis not present

## 2013-09-27 DIAGNOSIS — Z862 Personal history of diseases of the blood and blood-forming organs and certain disorders involving the immune mechanism: Secondary | ICD-10-CM | POA: Diagnosis not present

## 2013-09-27 DIAGNOSIS — Z8739 Personal history of other diseases of the musculoskeletal system and connective tissue: Secondary | ICD-10-CM | POA: Diagnosis not present

## 2013-09-27 DIAGNOSIS — R1310 Dysphagia, unspecified: Secondary | ICD-10-CM | POA: Diagnosis not present

## 2013-09-27 LAB — CSF CELL COUNT WITH DIFFERENTIAL
Tube #: 1
Tube #: 4
WBC, CSF: 0 /mm3 (ref 0–5)
WBC, CSF: 1 /mm3 (ref 0–5)

## 2013-09-27 LAB — HERPES SIMPLEX VIRUS(HSV) DNA BY PCR: HSV 1 DNA: NOT DETECTED

## 2013-09-27 MED ORDER — HYDROMORPHONE HCL PF 1 MG/ML IJ SOLN
1.0000 mg | Freq: Once | INTRAMUSCULAR | Status: AC
  Administered 2013-09-27: 1 mg via INTRAVENOUS
  Filled 2013-09-27: qty 1

## 2013-09-30 LAB — CSF CULTURE W GRAM STAIN: Special Requests: NORMAL

## 2013-11-09 ENCOUNTER — Other Ambulatory Visit: Payer: Self-pay

## 2013-11-09 DIAGNOSIS — Z1231 Encounter for screening mammogram for malignant neoplasm of breast: Secondary | ICD-10-CM

## 2013-12-04 ENCOUNTER — Ambulatory Visit
Admission: RE | Admit: 2013-12-04 | Discharge: 2013-12-04 | Disposition: A | Discharge status: Home / Self Care | Payer: BC Managed Care – PPO | Source: Ambulatory Visit

## 2013-12-04 DIAGNOSIS — Z1231 Encounter for screening mammogram for malignant neoplasm of breast: Secondary | ICD-10-CM

## 2014-10-22 ENCOUNTER — Ambulatory Visit (INDEPENDENT_AMBULATORY_CARE_PROVIDER_SITE_OTHER): Payer: BC Managed Care – PPO | Admitting: Internal Medicine

## 2014-10-22 ENCOUNTER — Encounter: Payer: Self-pay | Admitting: Internal Medicine

## 2014-10-22 VITALS — BP 116/80 | HR 70 | Temp 98.2°F | Wt 145.0 lb

## 2014-10-22 DIAGNOSIS — J069 Acute upper respiratory infection, unspecified: Secondary | ICD-10-CM

## 2014-10-22 DIAGNOSIS — J029 Acute pharyngitis, unspecified: Secondary | ICD-10-CM

## 2014-10-22 DIAGNOSIS — G032 Benign recurrent meningitis [Mollaret]: Secondary | ICD-10-CM

## 2014-10-22 DIAGNOSIS — Z8661 Personal history of infections of the central nervous system: Secondary | ICD-10-CM

## 2014-10-22 LAB — CBC WITH DIFFERENTIAL/PLATELET
BASOS ABS: 0 10*3/uL (ref 0.0–0.1)
Eosinophils Absolute: 0 10*3/uL (ref 0.0–0.7)
HCT: 36.4 % (ref 36.0–46.0)
Hemoglobin: 12.1 g/dL (ref 12.0–15.0)
Lymphocytes Relative: 28 % (ref 12–46)
Lymphs Abs: 2.7 10*3/uL (ref 0.7–4.0)
MCH: 30.9 pg (ref 26.0–34.0)
MCHC: 33.2 g/dL (ref 30.0–36.0)
MCV: 93 fL (ref 78.0–100.0)
Monocytes Absolute: 0.7 10*3/uL (ref 0.1–1.0)
Monocytes Relative: 7 % (ref 3–12)
Neutro Abs: 6.2 10*3/uL (ref 1.7–7.7)
Neutrophils Relative %: 65 % (ref 43–77)
PLATELETS: 174 10*3/uL (ref 150–400)
RBC: 3.92 MIL/uL (ref 3.87–5.11)
RDW: 13.8 % (ref 11.5–15.5)
WBC: 9.6 10*3/uL (ref 4.0–10.5)

## 2014-10-22 LAB — POCT RAPID STREP A (OFFICE): Rapid Strep A Screen: NEGATIVE

## 2014-10-22 MED ORDER — AMOXICILLIN-POT CLAVULANATE 875-125 MG PO TABS: 1.0000 | ORAL_TABLET | Freq: Two times a day (BID) | ORAL | Status: AC

## 2014-10-22 NOTE — Patient Instructions (Addendum)
Take Augmentin twice daily for 10 days. Call if symptoms do not improve in 48 hours.

## 2014-10-22 NOTE — Progress Notes (Signed)
   Subjective:    Patient ID: Rebecca GreeningSherri Dunlap, female    DOB: 03/22/1961, 53 y.o.   MRN: [id number]  HPI  Pt has had sore throat for 2 days. Not seen here since December 2013.    Hospitalized at [facility] September 27, 2013 for abnormal CT scan of the oronasopharynx at the level of the fossa Rosenmuller after presenting with right neck pain. She also complained of some pain in her right ear. Was complaining of some difficulty swallowing. Direct visualization was recommended by ENT to exclude neoplasm or inflammation. CT of the brain was negative. LP was performed and spinal fluid white blood cell count was 0-1. HSV PCR testing done because of history of a recurrent meningitis previously  evaluated at [facility].  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 No specific therapeutic interventions were made. She had a similar episode in 2006 but no diagnostic studies were undertaken. In June 2008 she was omitted to [facility] with fever and headache. Lumbar puncture showed low-grade lymphocytic pleocytosis with normal glucose and slightly elevated protein. PCR on CSF was again positive for HSV. She was treated with Valtrex 1 g 3 times daily responded well. At that point she was seen by Dr. Charmian MuffHigh at [facility] in November 2009. She was maintained on Valtrex high-dose for some 3 weeks. At that time she still had some recurrent headache and she was placed on prophylactic Valtrex 500 mg twice daily for 1 month then 500 mg daily. At that time she had positive HSV type II antibody. Negative HIV antibody. Dr. 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 He recommended long-term prophylaxis with Valtrex. She is also seen Dr. Kerby NoraPeacock, infectious disease specialist at [facility] in  November 2009.   Initially seen at [facility] and referred to [facility]. Normal white blood cell count. She had fiberoptic laryngoscopy. There was some nonspecific fullness on the right side of the nasopharynx. Eustachian tube opening look to be normal. ENT recommended she be treated for presumed bacterial nasopharyngitis.   No fever that she is aware of. No SOB. Slight cough that is mostly dry.  Because of her history of recurrent meningitis, we were able to quickly get her records from [facility].  She had a rapid strep screen here in the office it was negative. CBC with differential was normal.  She seemed to not understand how important it was that we do a careful evaluation today.  She is no longer on Valtrex suppression therapy.    Review of Systems     Objective:   Physical Exam  Pharynx slightly injected. TMs are clear. Neck supple chest clear. Kernig's and Brudzinski signs are negative.      Assessment & Plan:  Acute URI  Pharyngitis  Plan: Augmentin 875 mg twice daily for 10 days. Call if not better in 48 hours or sooner if worse.

## 2015-01-19 ENCOUNTER — Other Ambulatory Visit: Payer: Self-pay

## 2015-01-19 DIAGNOSIS — Z1231 Encounter for screening mammogram for malignant neoplasm of breast: Secondary | ICD-10-CM

## 2015-02-02 ENCOUNTER — Ambulatory Visit
Admission: RE | Admit: 2015-02-02 | Discharge: 2015-02-02 | Disposition: A | Discharge status: Home / Self Care | Payer: BLUE CROSS/BLUE SHIELD | Source: Ambulatory Visit

## 2015-02-02 DIAGNOSIS — Z1231 Encounter for screening mammogram for malignant neoplasm of breast: Secondary | ICD-10-CM

## 2015-09-15 ENCOUNTER — Other Ambulatory Visit: Payer: Self-pay | Admitting: Orthopedic Surgery

## 2015-09-15 DIAGNOSIS — M25561 Pain in right knee: Secondary | ICD-10-CM

## 2015-10-01 ENCOUNTER — Ambulatory Visit
Admission: RE | Admit: 2015-10-01 | Discharge: 2015-10-01 | Disposition: A | Discharge status: Home / Self Care | Payer: BLUE CROSS/BLUE SHIELD | Source: Ambulatory Visit | Attending: Orthopedic Surgery | Admitting: Orthopedic Surgery

## 2015-10-01 DIAGNOSIS — M25561 Pain in right knee: Secondary | ICD-10-CM

## 2016-03-29 ENCOUNTER — Other Ambulatory Visit: Payer: Self-pay

## 2016-03-29 DIAGNOSIS — Z1231 Encounter for screening mammogram for malignant neoplasm of breast: Secondary | ICD-10-CM

## 2016-04-13 ENCOUNTER — Ambulatory Visit
Admission: RE | Admit: 2016-04-13 | Discharge: 2016-04-13 | Disposition: A | Discharge status: Home / Self Care | Payer: BLUE CROSS/BLUE SHIELD | Source: Ambulatory Visit

## 2016-04-13 DIAGNOSIS — Z1231 Encounter for screening mammogram for malignant neoplasm of breast: Secondary | ICD-10-CM

## 2016-12-05 ENCOUNTER — Other Ambulatory Visit: Payer: Self-pay | Admitting: Orthopedic Surgery

## 2016-12-05 DIAGNOSIS — M545 Low back pain: Secondary | ICD-10-CM

## 2016-12-10 ENCOUNTER — Ambulatory Visit
Admission: RE | Admit: 2016-12-10 | Discharge: 2016-12-10 | Disposition: A | Discharge status: Home / Self Care | Payer: BLUE CROSS/BLUE SHIELD | Source: Ambulatory Visit | Attending: Orthopedic Surgery | Admitting: Orthopedic Surgery

## 2016-12-10 DIAGNOSIS — M545 Low back pain: Secondary | ICD-10-CM

## 2017-08-16 ENCOUNTER — Other Ambulatory Visit: Payer: Self-pay | Admitting: Internal Medicine

## 2017-08-16 DIAGNOSIS — Z1231 Encounter for screening mammogram for malignant neoplasm of breast: Secondary | ICD-10-CM

## 2017-08-30 ENCOUNTER — Ambulatory Visit
Admission: RE | Admit: 2017-08-30 | Discharge: 2017-08-30 | Disposition: A | Discharge status: Home / Self Care | Payer: BLUE CROSS/BLUE SHIELD | Source: Ambulatory Visit | Attending: Internal Medicine | Admitting: Internal Medicine

## 2017-08-30 DIAGNOSIS — Z1231 Encounter for screening mammogram for malignant neoplasm of breast: Secondary | ICD-10-CM

## 2017-09-03 ENCOUNTER — Other Ambulatory Visit: Payer: Self-pay | Admitting: Internal Medicine

## 2017-09-03 DIAGNOSIS — R928 Other abnormal and inconclusive findings on diagnostic imaging of breast: Secondary | ICD-10-CM

## 2017-09-09 ENCOUNTER — Ambulatory Visit
Admission: RE | Admit: 2017-09-09 | Discharge: 2017-09-09 | Disposition: A | Discharge status: Home / Self Care | Payer: BLUE CROSS/BLUE SHIELD | Source: Ambulatory Visit | Attending: Internal Medicine | Admitting: Internal Medicine

## 2017-09-09 DIAGNOSIS — R928 Other abnormal and inconclusive findings on diagnostic imaging of breast: Secondary | ICD-10-CM

## 2018-07-11 ENCOUNTER — Other Ambulatory Visit: Payer: Self-pay | Admitting: Orthopedic Surgery

## 2018-07-11 DIAGNOSIS — G8929 Other chronic pain: Secondary | ICD-10-CM

## 2018-07-11 DIAGNOSIS — M25562 Pain in left knee: Principal | ICD-10-CM

## 2018-07-19 ENCOUNTER — Ambulatory Visit
Admission: RE | Admit: 2018-07-19 | Discharge: 2018-07-19 | Disposition: A | Discharge status: Home / Self Care | Payer: BLUE CROSS/BLUE SHIELD | Source: Ambulatory Visit | Attending: Orthopedic Surgery | Admitting: Orthopedic Surgery

## 2018-07-19 DIAGNOSIS — M25562 Pain in left knee: Principal | ICD-10-CM

## 2018-07-19 DIAGNOSIS — G8929 Other chronic pain: Secondary | ICD-10-CM

## 2018-10-14 ENCOUNTER — Other Ambulatory Visit: Payer: Self-pay | Admitting: Internal Medicine

## 2018-10-14 DIAGNOSIS — Z1231 Encounter for screening mammogram for malignant neoplasm of breast: Secondary | ICD-10-CM

## 2019-08-24 ENCOUNTER — Other Ambulatory Visit: Payer: Self-pay | Admitting: Internal Medicine

## 2019-08-24 DIAGNOSIS — Z1231 Encounter for screening mammogram for malignant neoplasm of breast: Secondary | ICD-10-CM

## 2019-10-07 ENCOUNTER — Ambulatory Visit: Payer: BLUE CROSS/BLUE SHIELD

## 2020-05-10 ENCOUNTER — Other Ambulatory Visit: Payer: Self-pay

## 2020-05-10 ENCOUNTER — Ambulatory Visit
Admission: RE | Admit: 2020-05-10 | Discharge: 2020-05-10 | Disposition: A | Discharge status: Home / Self Care | Payer: Managed Care, Other (non HMO) | Source: Ambulatory Visit | Attending: Internal Medicine | Admitting: Internal Medicine

## 2020-05-10 DIAGNOSIS — Z1231 Encounter for screening mammogram for malignant neoplasm of breast: Secondary | ICD-10-CM

## 2021-06-20 ENCOUNTER — Other Ambulatory Visit: Payer: Self-pay | Admitting: Internal Medicine

## 2021-06-20 DIAGNOSIS — Z1231 Encounter for screening mammogram for malignant neoplasm of breast: Secondary | ICD-10-CM

## 2021-06-21 ENCOUNTER — Other Ambulatory Visit: Payer: Self-pay

## 2021-06-21 ENCOUNTER — Ambulatory Visit
Admission: RE | Admit: 2021-06-21 | Discharge: 2021-06-21 | Disposition: A | Discharge status: Home / Self Care | Payer: Managed Care, Other (non HMO) | Source: Ambulatory Visit | Attending: Internal Medicine | Admitting: Internal Medicine

## 2021-06-21 DIAGNOSIS — Z1231 Encounter for screening mammogram for malignant neoplasm of breast: Secondary | ICD-10-CM

## 2022-04-01 IMAGING — MG MM DIGITAL SCREENING BILAT W/ TOMO AND CAD
8 series · 8 of 24 positions shown · non-contrast
Comparison: Previous exam(s).

CLINICAL DATA: Screening.

EXAM:
DIGITAL SCREENING BILATERAL MAMMOGRAM WITH TOMOSYNTHESIS AND CAD
TECHNIQUE: Bilateral screening digital craniocaudal and mediolateral oblique
mammograms were obtained. Bilateral screening digital breast
tomosynthesis was performed. The images were evaluated with
computer-aided detection.

[L CC synth-2D]
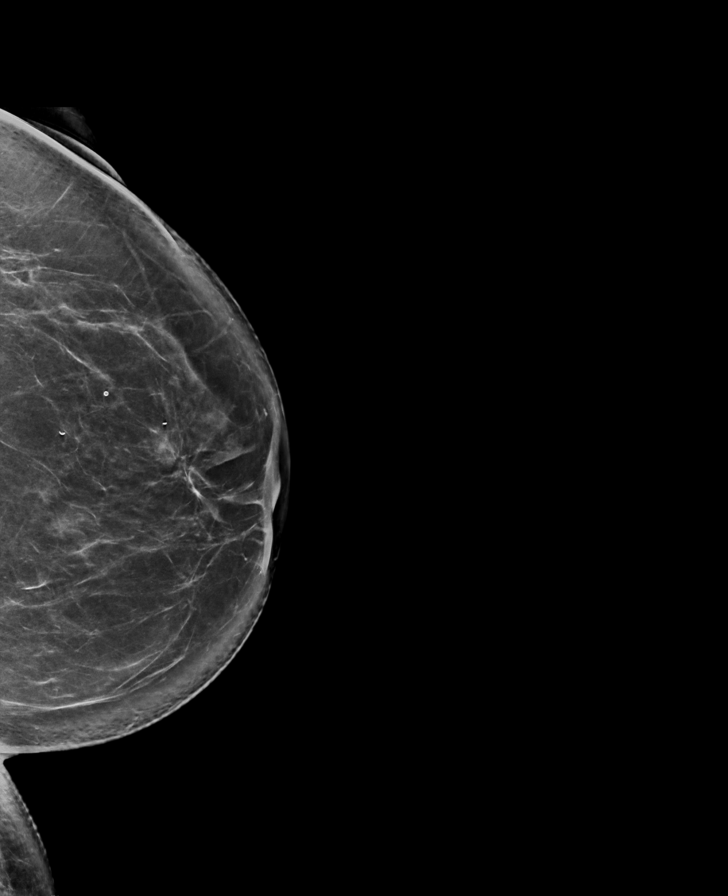

[R MLO synth-2D]
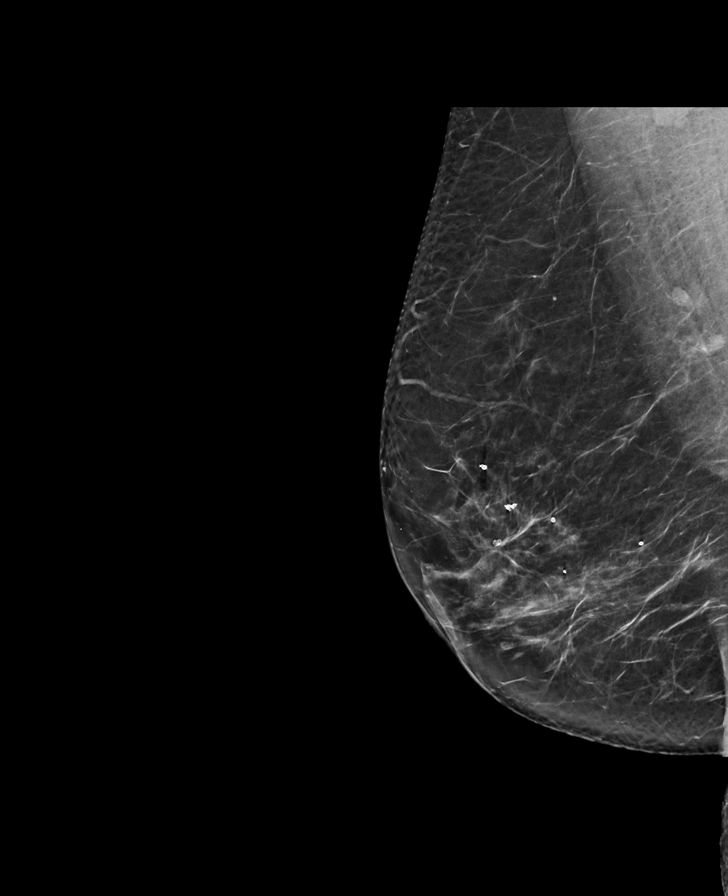

[R CC synth-2D]
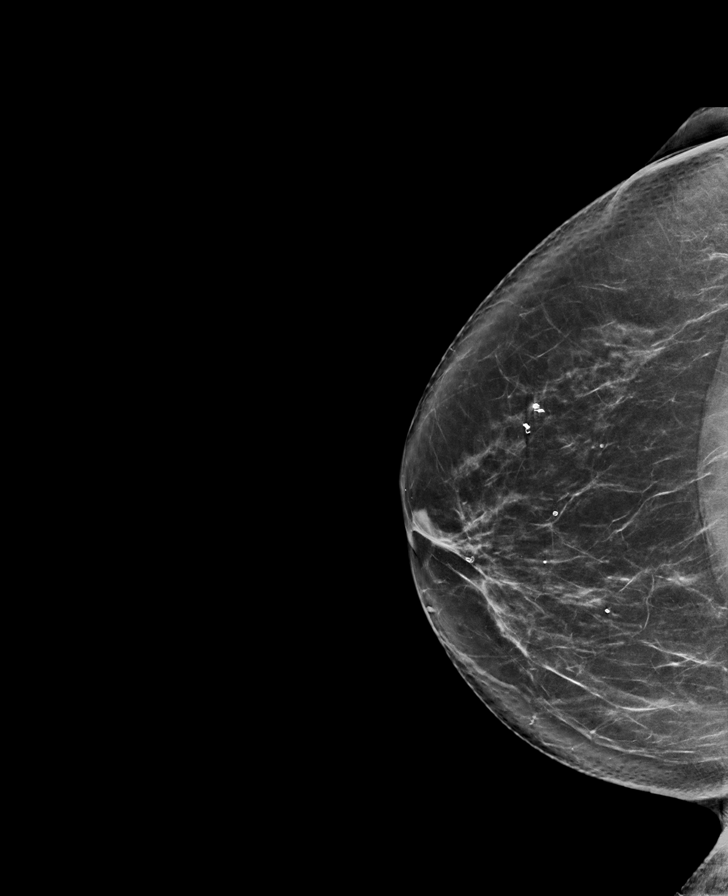

[L MLO synth-2D]
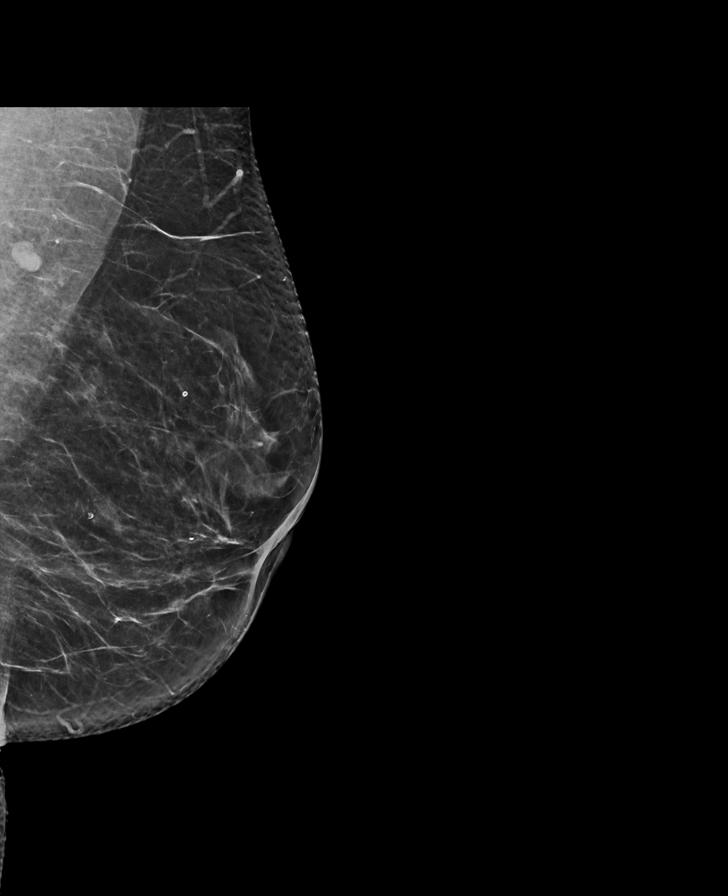

[L MLO tomo · tomo slice 41/80.0]
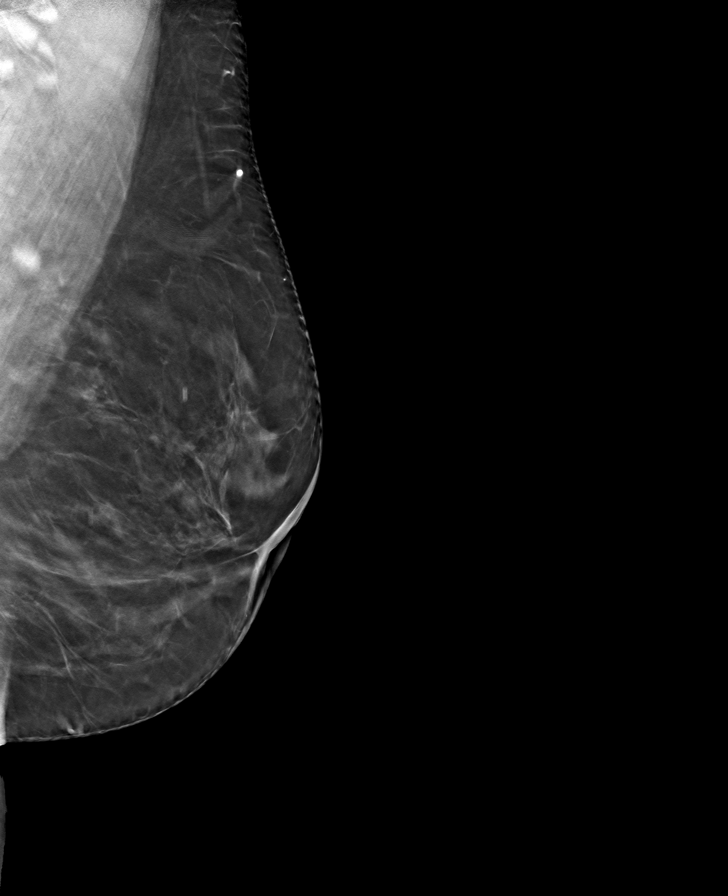

[R MLO tomo · tomo slice 41/80.0]
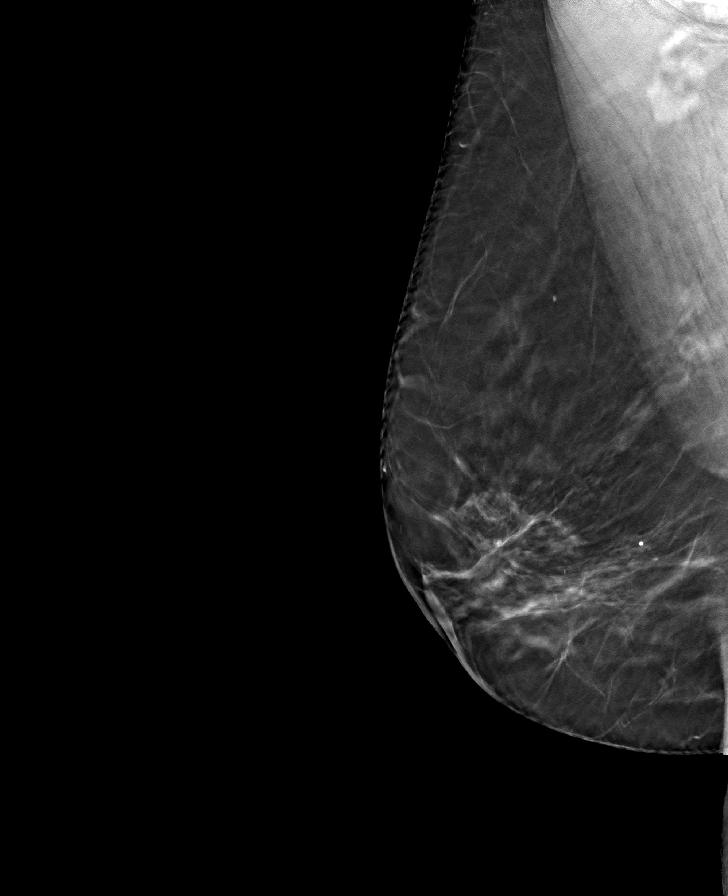

[R CC tomo · tomo slice 43/86.0]
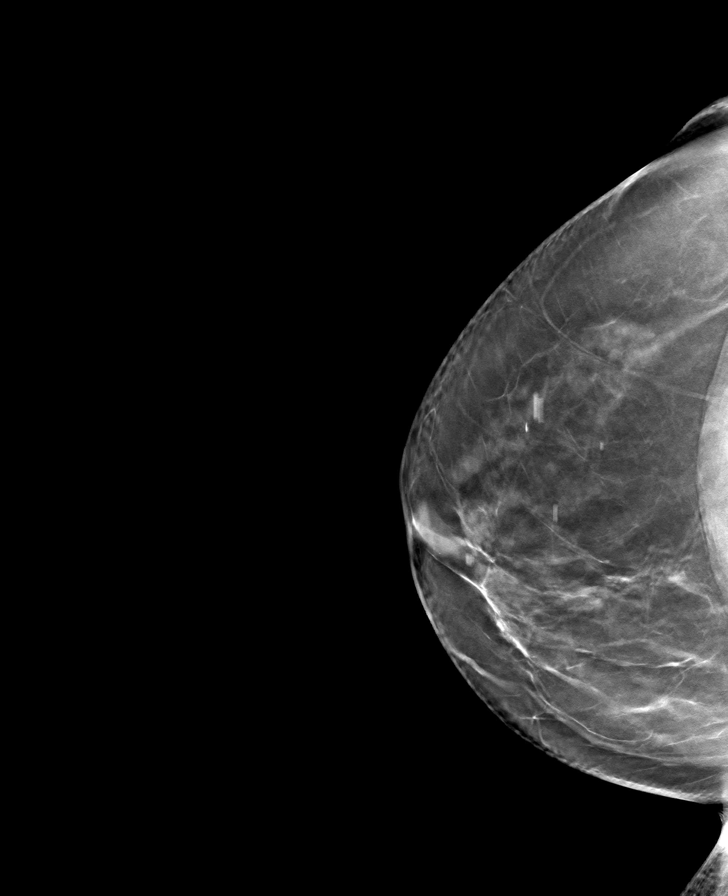

[L CC tomo · tomo slice 44/87.0]
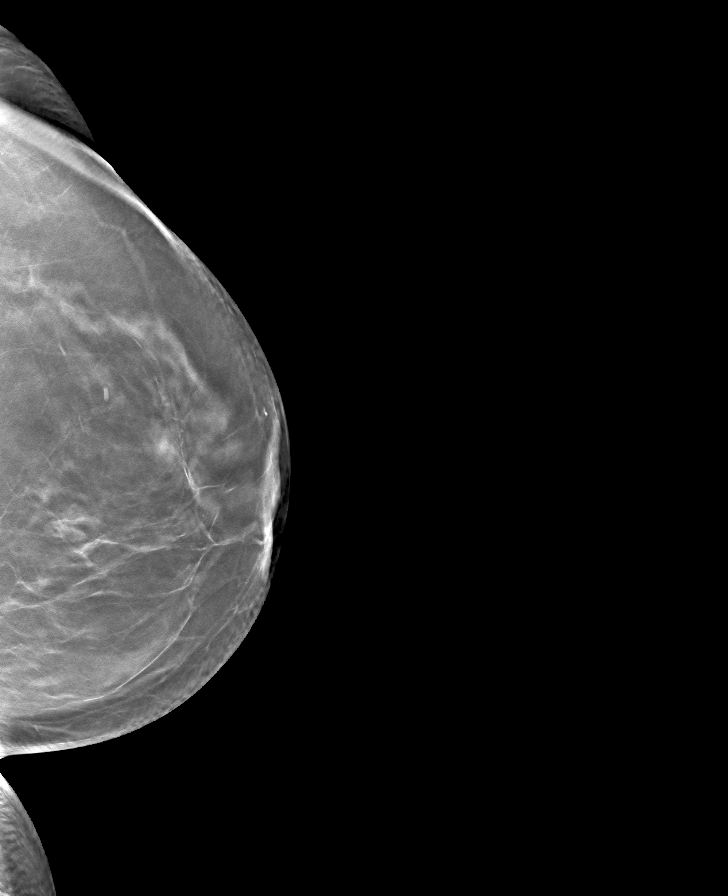

[8 of 24 positions shown; findings below may reference images not displayed]

ACR Breast Density Category b: There are scattered areas of
fibroglandular density.
FINDINGS: There are no findings suspicious for malignancy.
IMPRESSION: No mammographic evidence of malignancy. A result letter of this
screening mammogram will be mailed directly to the patient.

RECOMMENDATION:
Screening mammogram in one year. (Code:51-O-LD2)

BI-RADS CATEGORY  1: Negative.

## 2022-07-18 ENCOUNTER — Other Ambulatory Visit: Payer: Self-pay | Admitting: Internal Medicine

## 2022-07-18 DIAGNOSIS — Z1231 Encounter for screening mammogram for malignant neoplasm of breast: Secondary | ICD-10-CM

## 2022-07-30 ENCOUNTER — Ambulatory Visit
Admission: RE | Admit: 2022-07-30 | Discharge: 2022-07-30 | Disposition: A | Discharge status: Home / Self Care | Payer: Managed Care, Other (non HMO) | Source: Ambulatory Visit | Attending: Internal Medicine | Admitting: Internal Medicine

## 2022-07-30 DIAGNOSIS — Z1231 Encounter for screening mammogram for malignant neoplasm of breast: Secondary | ICD-10-CM

## 2024-10-16 ENCOUNTER — Other Ambulatory Visit: Payer: Self-pay | Admitting: Internal Medicine

## 2024-10-16 DIAGNOSIS — Z1231 Encounter for screening mammogram for malignant neoplasm of breast: Secondary | ICD-10-CM
